# Patient Record
Sex: Male | Born: 1990 | Smoking: Never smoker
Health system: Southern US, Community
[De-identification: ages and names within clinical notes are randomized; demographics above are authoritative.]

---

## 2020-02-20 ENCOUNTER — Encounter: Payer: Self-pay | Admitting: *Deleted

## 2020-02-20 ENCOUNTER — Other Ambulatory Visit: Payer: Self-pay

## 2020-02-20 ENCOUNTER — Emergency Department: Payer: Self-pay

## 2020-02-20 DIAGNOSIS — R0602 Shortness of breath: Secondary | ICD-10-CM | POA: Insufficient documentation

## 2020-02-20 DIAGNOSIS — Z5321 Procedure and treatment not carried out due to patient leaving prior to being seen by health care provider: Secondary | ICD-10-CM | POA: Insufficient documentation

## 2020-02-20 DIAGNOSIS — R1110 Vomiting, unspecified: Secondary | ICD-10-CM | POA: Insufficient documentation

## 2020-02-20 DIAGNOSIS — R1011 Right upper quadrant pain: Secondary | ICD-10-CM | POA: Insufficient documentation

## 2020-02-20 LAB — CBC
HCT: 42.4 % (ref 39.0–52.0)
Hemoglobin: 14.3 g/dL (ref 13.0–17.0)
MCH: 31 pg (ref 26.0–34.0)
MCHC: 33.7 g/dL (ref 30.0–36.0)
MCV: 91.8 fL (ref 80.0–100.0)
Platelets: 321 10*3/uL (ref 150–400)
RBC: 4.62 MIL/uL (ref 4.22–5.81)
RDW: 12.4 % (ref 11.5–15.5)
WBC: 16.1 10*3/uL — ABNORMAL HIGH (ref 4.0–10.5)
nRBC: 0 % (ref 0.0–0.2)

## 2020-02-20 LAB — COMPREHENSIVE METABOLIC PANEL
ALT: 31 U/L (ref 0–44)
AST: 27 U/L (ref 15–41)
Albumin: 4.8 g/dL (ref 3.5–5.0)
Alkaline Phosphatase: 67 U/L (ref 38–126)
Anion gap: 12 (ref 5–15)
BUN: 15 mg/dL (ref 6–20)
CO2: 25 mmol/L (ref 22–32)
Calcium: 9.3 mg/dL (ref 8.9–10.3)
Chloride: 103 mmol/L (ref 98–111)
Creatinine, Ser: 0.88 mg/dL (ref 0.61–1.24)
GFR calc Af Amer: >60 mL/min (ref >60)
GFR calc non Af Amer: >60 mL/min (ref >60)
Glucose, Bld: 120 mg/dL — ABNORMAL HIGH (ref 70–99)
Potassium: 3.8 mmol/L (ref 3.5–5.1)
Sodium: 140 mmol/L (ref 135–145)
Total Bilirubin: 0.9 mg/dL (ref 0.3–1.2)
Total Protein: 8 g/dL (ref 6.5–8.1)

## 2020-02-20 LAB — LIPASE, BLOOD: Lipase: 22 U/L (ref 11–51)

## 2020-02-20 NOTE — ED Triage Notes (Signed)
Pt to ED via EMS reporting RUQ pain that started at 17:00 today after eating.  Pain has since subsided. Similar incident on Sunday as well. One episode of vomiting, no nausea or diarrhea. No fevers. Pt also reporting the pain feels as though she is SOB when it is at its worst.

## 2020-02-21 ENCOUNTER — Emergency Department
Admission: EM | Admit: 2020-02-21 | Discharge: 2020-02-21 | Disposition: A | Discharge status: Home / Self Care | Payer: Self-pay | Attending: Emergency Medicine | Admitting: Emergency Medicine

## 2021-02-21 IMAGING — CR DG CHEST 2V
1 series · 2 of 2 positions shown · non-contrast
Comparison: None.

CLINICAL DATA: Shortness of breath, right upper quadrant pain at
6722 hours after eating

EXAM:
CHEST - 2 VIEW

[Series 1: dg chest 2 view · 0.14mm/px · 2 of 2 slices shown]
[im 1/2]
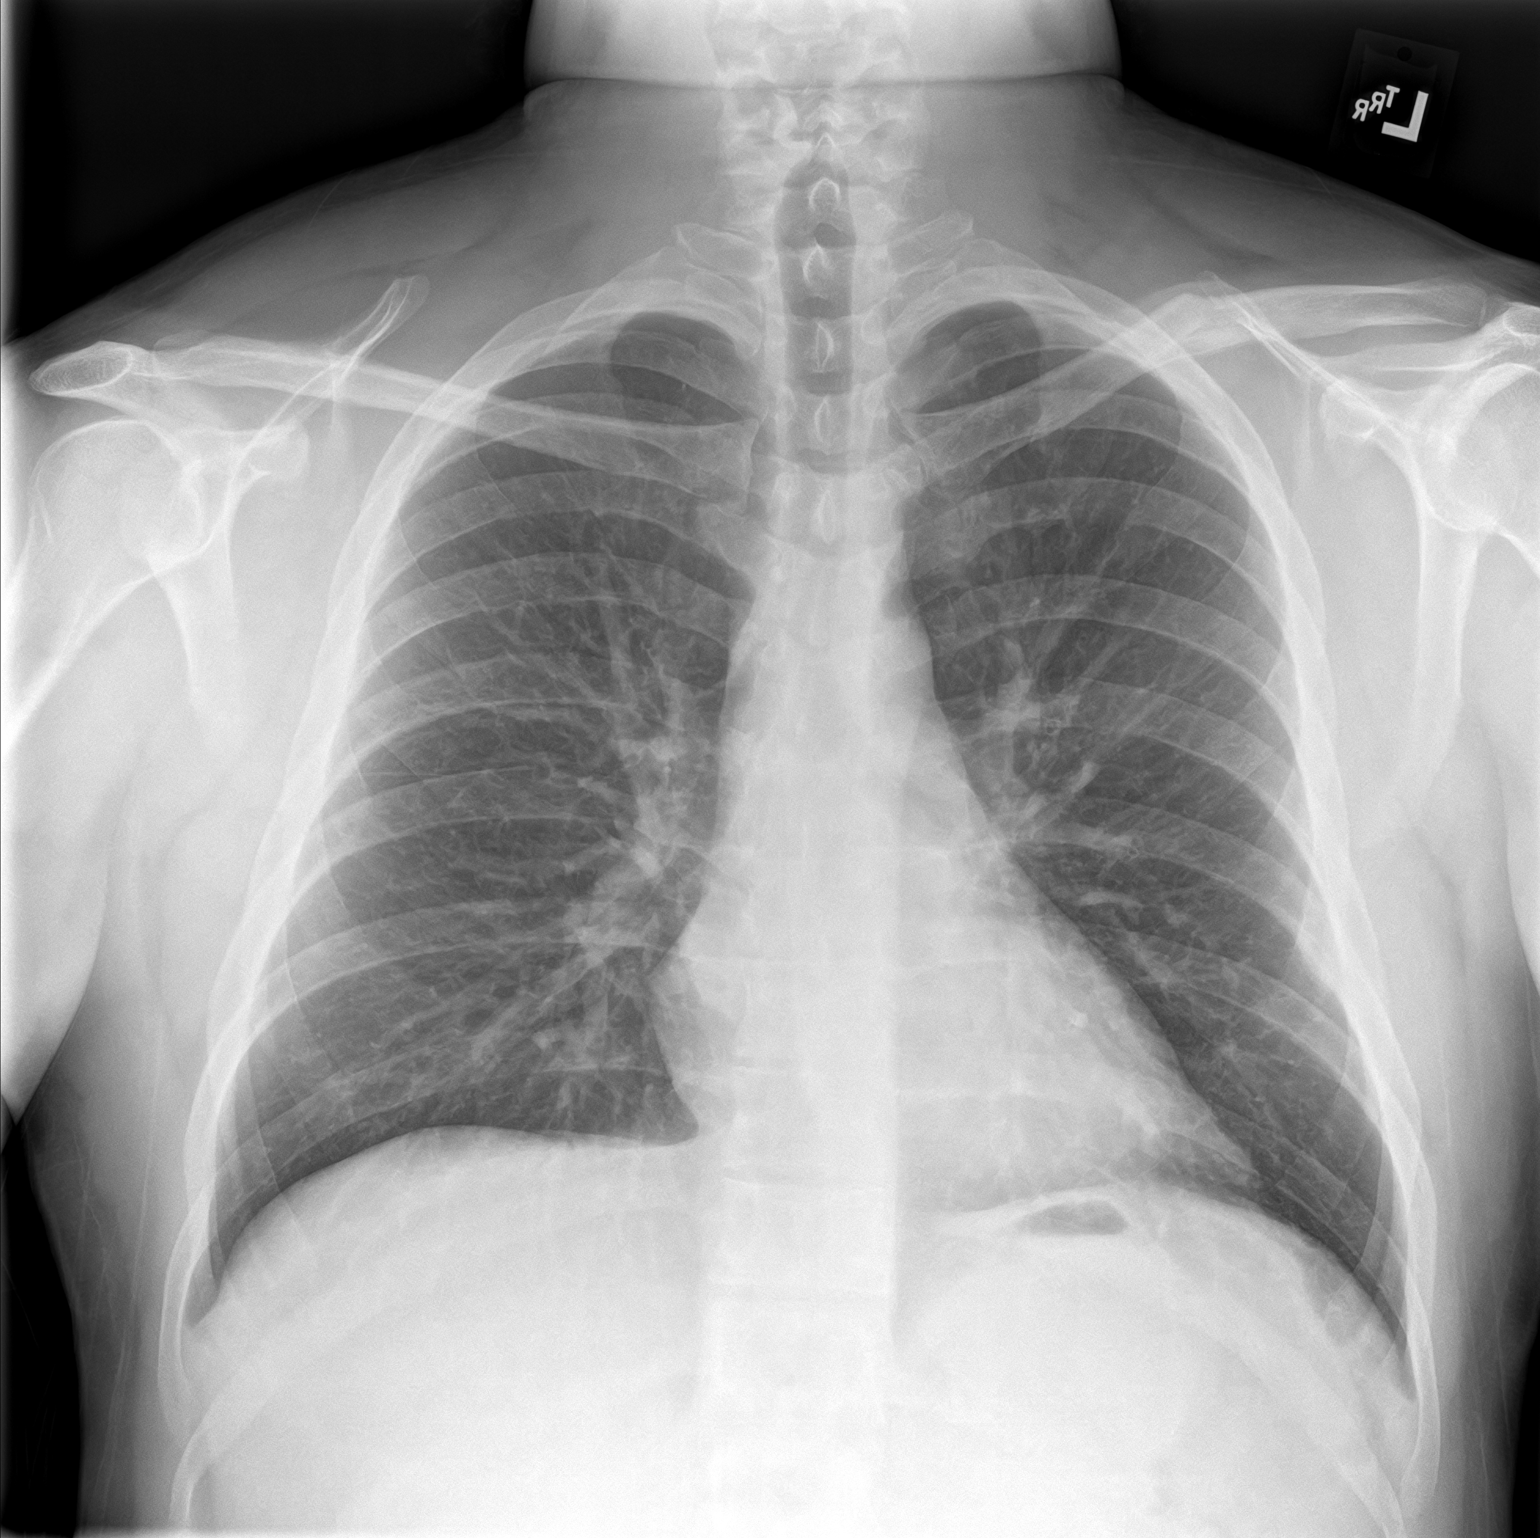
[im 2/2]
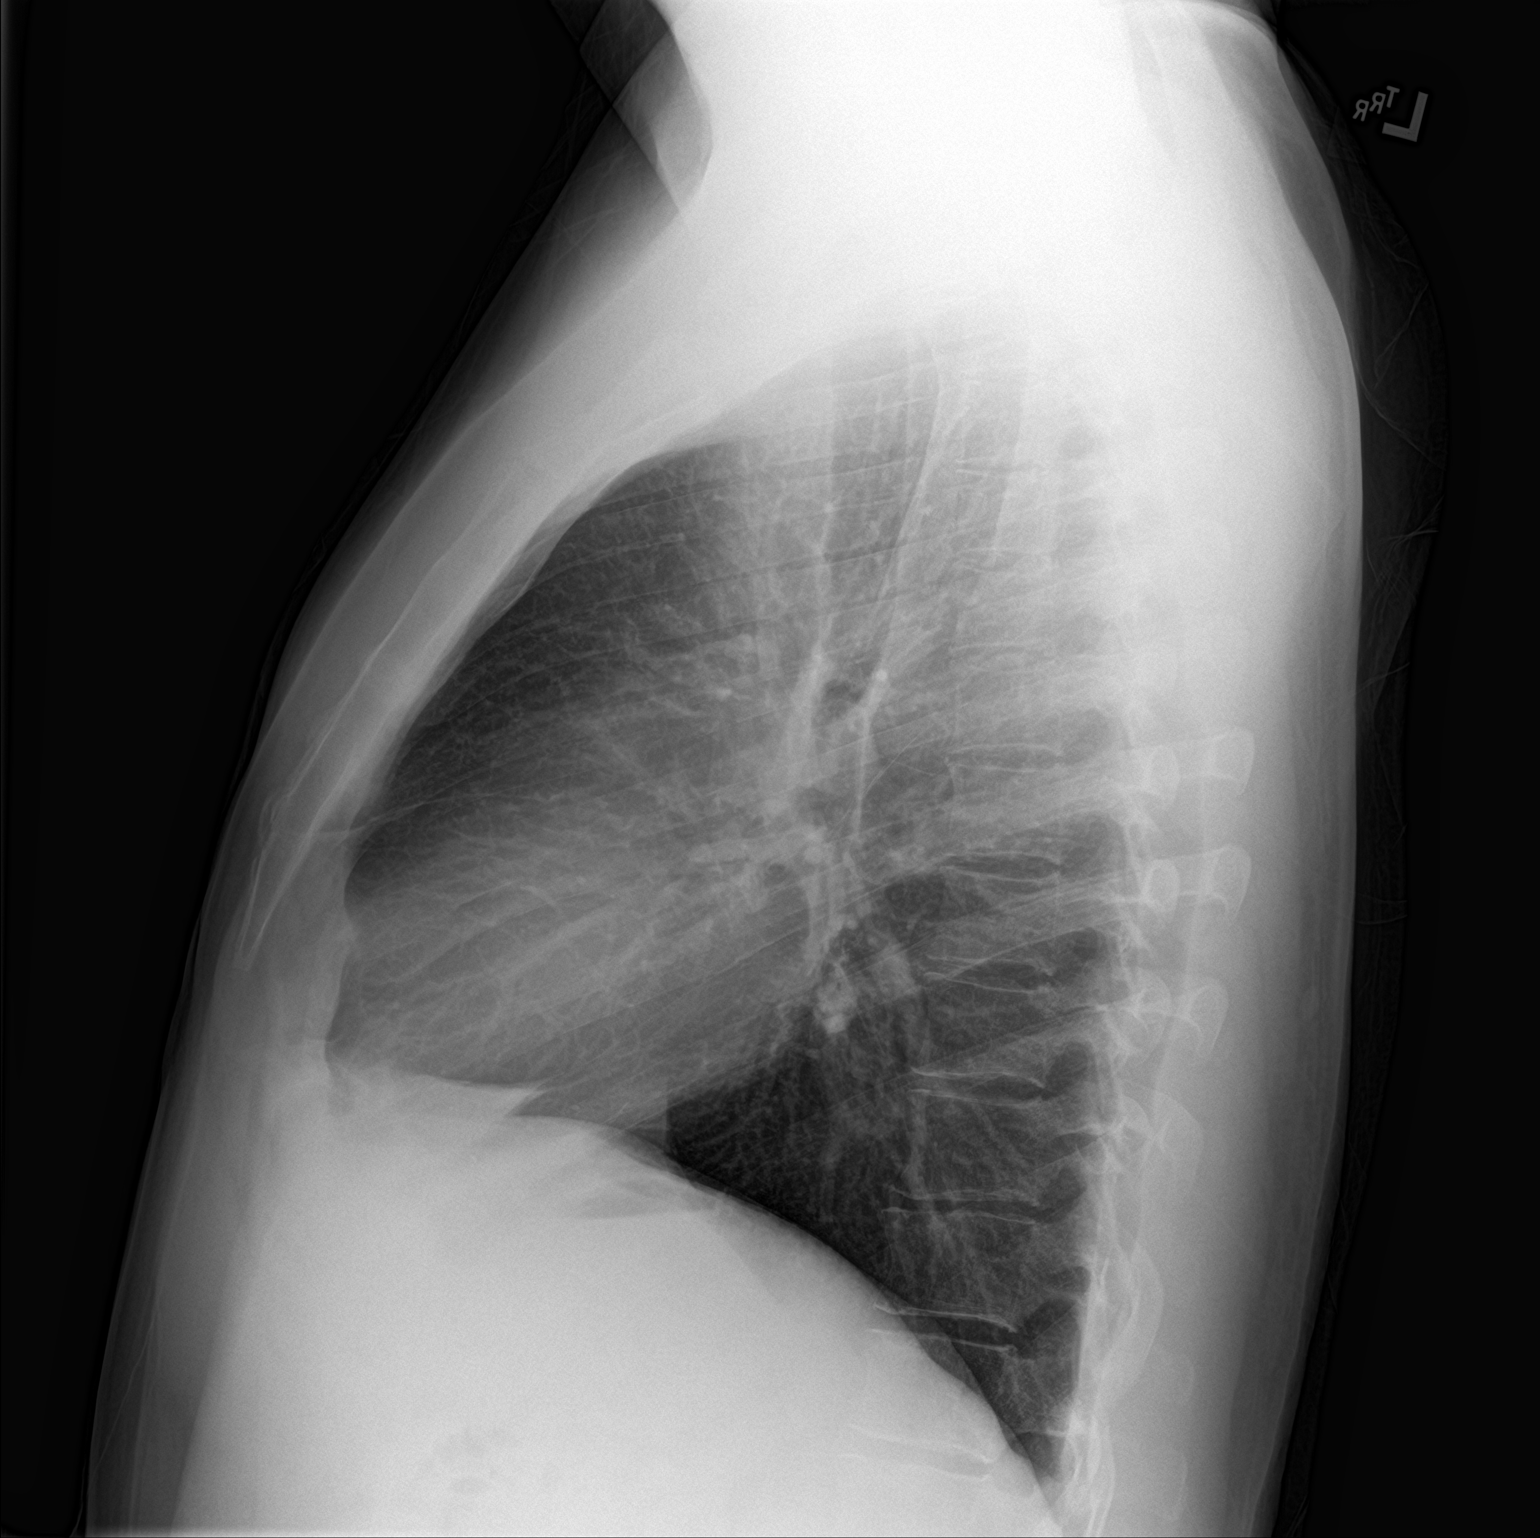

[2 of 2 positions shown; findings below may reference images not displayed]

FINDINGS: The heart size and mediastinal contours are within normal limits.
Both lungs are clear. The visualized skeletal structures are
unremarkable.
IMPRESSION: No active cardiopulmonary disease.
